# Patient Record
Sex: Male | Born: 1985 | Race: White | Hispanic: No | Marital: Married | Smoking: Former smoker
Health system: Southern US, Community
[De-identification: ages and names within clinical notes are randomized; demographics above are authoritative.]

## PROBLEM LIST (undated history)

## (undated) DIAGNOSIS — Z8619 Personal history of other infectious and parasitic diseases: Secondary | ICD-10-CM

## (undated) DIAGNOSIS — F4320 Adjustment disorder, unspecified: Secondary | ICD-10-CM

## (undated) DIAGNOSIS — R51 Headache: Secondary | ICD-10-CM

## (undated) DIAGNOSIS — J302 Other seasonal allergic rhinitis: Secondary | ICD-10-CM

## (undated) DIAGNOSIS — E785 Hyperlipidemia, unspecified: Secondary | ICD-10-CM

## (undated) DIAGNOSIS — R519 Headache, unspecified: Secondary | ICD-10-CM

## (undated) HISTORY — DX: Headache: R51

## (undated) HISTORY — DX: Personal history of other infectious and parasitic diseases: Z86.19

## (undated) HISTORY — DX: Headache, unspecified: R51.9

## (undated) HISTORY — DX: Hyperlipidemia, unspecified: E78.5

## (undated) HISTORY — DX: Other seasonal allergic rhinitis: J30.2

## (undated) HISTORY — DX: Adjustment disorder, unspecified: F43.20

---
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
## 2008-10-09 DIAGNOSIS — F4320 Adjustment disorder, unspecified: Secondary | ICD-10-CM

## 2008-10-09 HISTORY — DX: Adjustment disorder, unspecified: F43.20

## 2008-10-09 HISTORY — PX: MENISCUS REPAIR: SHX5179

## 2013-08-20 ENCOUNTER — Emergency Department: Payer: Self-pay | Admitting: Emergency Medicine

## 2013-11-18 ENCOUNTER — Encounter: Payer: Self-pay | Admitting: Family Medicine

## 2013-11-18 ENCOUNTER — Ambulatory Visit (INDEPENDENT_AMBULATORY_CARE_PROVIDER_SITE_OTHER): Payer: BC Managed Care – PPO | Admitting: Family Medicine

## 2013-11-18 VITALS — BP 126/74 | HR 76 | Temp 97.9°F | Ht 72.0 in | Wt 217.2 lb

## 2013-11-18 DIAGNOSIS — E785 Hyperlipidemia, unspecified: Secondary | ICD-10-CM

## 2013-11-18 DIAGNOSIS — J309 Allergic rhinitis, unspecified: Secondary | ICD-10-CM

## 2013-11-18 DIAGNOSIS — Z0000 Encounter for general adult medical examination without abnormal findings: Secondary | ICD-10-CM

## 2013-11-18 DIAGNOSIS — J302 Other seasonal allergic rhinitis: Secondary | ICD-10-CM

## 2013-11-18 DIAGNOSIS — R51 Headache: Secondary | ICD-10-CM

## 2013-11-18 DIAGNOSIS — R519 Headache, unspecified: Secondary | ICD-10-CM

## 2013-11-18 NOTE — Assessment & Plan Note (Signed)
Preventative protocols reviewed and updated unless pt declined. Discussed healthy diet and lifestyle.  

## 2013-11-18 NOTE — Assessment & Plan Note (Signed)
Will check FLP when he returns for next fasting blood work.

## 2013-11-18 NOTE — Progress Notes (Signed)
Pre-visit discussion using our clinic review tool. No additional management support is needed unless otherwise documented below in the visit note.  

## 2013-11-18 NOTE — Patient Instructions (Signed)
Good to meet you today, call us with questions. Return at your convenience fasting for blood work to check cholesterol levels.

## 2013-11-18 NOTE — Progress Notes (Signed)
BP 126/74  Pulse 76  Temp(Src) 97.9 F (36.6 C) (Oral)  Ht 6' (1.829 m)  Wt 217 lb 4 oz (98.544 kg)  BMI 29.46 kg/m2   CC: new pt to establish, desires CPE  Subjective:    Patient ID: Randy Riddle, male    DOB: Aug 06, 1986, 28 y.o.   MRN: [id number]  HPI: Randy Lingereter Withrow is a 28 y.o. 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  Has 26yo and 26yo.  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 Allergies and medications reviewed and updated. No current outpatient prescriptions on file prior to visit.   No current facility-administered medications on file prior to visit.    Review of Systems  Constitutional: Negative for fever, chills, activity change, appetite change, fatigue and unexpected weight change.  HENT: Negative for hearing loss.   Eyes: Negative for visual disturbance.  Respiratory: Positive for cough (getting over cold). Negative for chest tightness, shortness of breath and wheezing.   Cardiovascular: Negative for chest pain, palpitations and leg swelling.  Gastrointestinal: Negative for nausea, vomiting, abdominal pain, diarrhea, constipation, blood in stool and abdominal distention.  Genitourinary: Negative for hematuria and difficulty urinating.  Musculoskeletal: Negative for arthralgias, myalgias and neck pain.  Skin: Negative for rash.  Neurological: Positive for headaches (occasional). Negative for dizziness, seizures and syncope.  Hematological: Negative for adenopathy. Does not bruise/bleed easily.  Psychiatric/Behavioral: Negative for dysphoric mood. The patient is not nervous/anxious.     Per HPI unless specifically indicated above    Objective:    BP 126/74  Pulse 76  Temp(Src) 97.9 F (36.6 C) (Oral)  Ht 6' (1.829 m)  Wt 217 lb 4 oz (98.544 kg)  BMI 29.46 kg/m2  Physical Exam  Nursing note and vitals reviewed. Constitutional: He is oriented to person, place, and time. He appears well-developed and well-nourished. No distress.  HENT:  Head: Normocephalic and atraumatic.  Right Ear: Hearing, tympanic membrane, external ear and ear canal normal.  Left Ear: Hearing, tympanic membrane, external ear and ear canal normal.  Nose: Nose normal.  Mouth/Throat: Uvula is midline, oropharynx is clear and moist and mucous membranes are normal. No oropharyngeal exudate, posterior oropharyngeal edema or posterior oropharyngeal erythema.  Eyes: Conjunctivae and EOM are normal. Pupils are equal, round, and reactive to light. No scleral icterus.  Neck: Normal range of motion. Neck supple. No thyromegaly present.  Cardiovascular: Normal rate, regular rhythm, normal heart sounds and intact distal pulses.   No murmur heard. Pulses:      Radial pulses are 2+ on the right side, and 2+ on the left side.  Pulmonary/Chest: Effort normal and breath sounds normal. No respiratory distress. He has no wheezes. He has no rales.  Abdominal: Soft. Bowel sounds are normal. He exhibits no distension and no mass. There is no tenderness. There is no rebound and no guarding.  Musculoskeletal: Normal range of motion. He exhibits no edema.  Lymphadenopathy:    He has no cervical adenopathy.  Neurological: He is alert and oriented to person, place, and time.  CN grossly intact, station and gait intact  Skin: Skin is warm and dry. No rash noted.  Psychiatric: He  has a normal mood and affect. His behavior is normal. Judgment and thought content normal.   No results found for this or any previous visit.    Assessment & Plan:   Problem List Items Addressed This Visit   Headache   Health maintenance  examination - Primary     Preventative protocols reviewed and updated unless pt declined. Discussed healthy diet and lifestyle.    HLD (hyperlipidemia)     Will check FLP when he returns for next fasting blood work.    Seasonal allergies       Follow up plan: No Follow-up on file.

## 2013-11-20 ENCOUNTER — Other Ambulatory Visit (INDEPENDENT_AMBULATORY_CARE_PROVIDER_SITE_OTHER): Payer: BC Managed Care – PPO

## 2013-11-20 DIAGNOSIS — E785 Hyperlipidemia, unspecified: Secondary | ICD-10-CM

## 2013-11-20 LAB — LIPID PANEL
CHOLESTEROL: 229 mg/dL — AB (ref 0–200)
HDL: 47.2 mg/dL (ref >39.00)
TRIGLYCERIDES: 145 mg/dL (ref 0.0–149.0)
Total CHOL/HDL Ratio: 5
VLDL: 29 mg/dL (ref 0.0–40.0)

## 2013-11-20 LAB — BASIC METABOLIC PANEL
BUN: 14 mg/dL (ref 6–23)
CO2: 27 mEq/L (ref 19–32)
CREATININE: 1.1 mg/dL (ref 0.4–1.5)
Calcium: 9.9 mg/dL (ref 8.4–10.5)
Chloride: 104 mEq/L (ref 96–112)
GFR: 86.07 mL/min (ref >60.00)
Glucose, Bld: 90 mg/dL (ref 70–99)
Potassium: 3.8 mEq/L (ref 3.5–5.1)
Sodium: 140 mEq/L (ref 135–145)

## 2013-11-20 LAB — LDL CHOLESTEROL, DIRECT: LDL DIRECT: 107.3 mg/dL

## 2013-11-24 ENCOUNTER — Encounter: Payer: Self-pay | Admitting: *Deleted

## 2014-01-05 ENCOUNTER — Ambulatory Visit (INDEPENDENT_AMBULATORY_CARE_PROVIDER_SITE_OTHER)
Admission: RE | Admit: 2014-01-05 | Discharge: 2014-01-05 | Disposition: A | Discharge status: Home / Self Care | Payer: BC Managed Care – PPO | Source: Ambulatory Visit | Attending: Family Medicine | Admitting: Family Medicine

## 2014-01-05 ENCOUNTER — Ambulatory Visit (INDEPENDENT_AMBULATORY_CARE_PROVIDER_SITE_OTHER): Payer: BC Managed Care – PPO | Admitting: Family Medicine

## 2014-01-05 ENCOUNTER — Encounter: Payer: Self-pay | Admitting: Family Medicine

## 2014-01-05 VITALS — BP 118/78 | HR 88 | Temp 98.4°F | Wt 224.5 lb

## 2014-01-05 DIAGNOSIS — S6990XA Unspecified injury of unspecified wrist, hand and finger(s), initial encounter: Secondary | ICD-10-CM

## 2014-01-05 DIAGNOSIS — S6992XA Unspecified injury of left wrist, hand and finger(s), initial encounter: Secondary | ICD-10-CM

## 2014-01-05 NOTE — Progress Notes (Signed)
   BP 118/78  Pulse 88  Temp(Src) 98.4 F (36.9 C) (Oral)  Wt 224 lb 8 oz (101.833 kg)   CC: L hand injury  Subjective:    Patient ID: Randy Riddle, male    DOB: [birth year], 28 y.o.   MRN: [id number]  HPI: Randy Lingereter Shelden is a 28 y.o. male presenting on 01/05/2014 for left hand swollen   DOI: 01/01/2014 At work - hit L hand with dead blow hammer.  Swelling L dorsal mid-hand just proximal to 2nd MCP. Using ibuprofen and ice.  Relevant past medical, surgical, family and social history reviewed and updated as indicated.  Allergies and medications reviewed and updated. Current Outpatient Prescriptions on File Prior to Visit  Medication Sig  . Multiple Vitamin (MULTIVITAMIN) tablet Take 1 tablet by mouth daily.   No current facility-administered medications on file prior to visit.    Review of Systems Per HPI unless specifically indicated above    Objective:    BP 118/78  Pulse 88  Temp(Src) 98.4 F (36.9 C) (Oral)  Wt 224 lb 8 oz (101.833 kg)  Physical Exam  Nursing note and vitals reviewed. Constitutional: He appears well-developed and well-nourished. No distress.  Musculoskeletal: He exhibits edema.  FROM at wrists without tenderness to palpation.   Bruising dorsal and ventral palm Marked swelling and point tenderness at distal 2nd L metacarpal just proximal to MCP joint. Sensation intact, pulses intact  Skin: Skin is warm and dry. Bruising noted.   Results for orders placed in visit on 11/20/13  LIPID PANEL      Result Value Ref Range   Cholesterol 229 (*) 0 - 200 mg/dL   Triglycerides 478.2145.0  0.0 - 149.0 mg/dL   HDL 95.6247.20  >13.08>39.00 mg/dL   VLDL 65.729.0  0.0 - 84.640.0 mg/dL   Total CHOL/HDL Ratio 5    BASIC METABOLIC PANEL      Result Value Ref Range   Sodium 140  135 - 145 mEq/L   Potassium 3.8  3.5 - 5.1 mEq/L   Chloride 104  96 - 112 mEq/L   CO2 27  19 - 32 mEq/L   Glucose, Bld 90  70 - 99 mg/dL   BUN 14  6 - 23 mg/dL   Creatinine, Ser 1.1  0.4 - 1.5 mg/dL   Calcium  9.9  8.4 - 96.210.5 mg/dL   GFR 95.2886.07  >41.32>60.00 mL/min  LDL CHOLESTEROL, DIRECT      Result Value Ref Range   Direct LDL 107.3        Assessment & Plan:   Problem List Items Addressed This Visit   Injury of left hand - Primary     Occurred at work, work aware. Xray today - overall clear on my read, will await rad report. Anticipate significant bony contusion - will treat with wrapping hand with ace bandage, ice, NSAIDs and tramadol prn breakthrough pain rtc 1 wk for f/u.    Relevant Orders      DG Hand Complete Left       Follow up plan: Return in about 1 week (around 01/12/2014), or if symptoms worsen or fail to improve, for follow up visit.

## 2014-01-05 NOTE — Assessment & Plan Note (Signed)
Occurred at work, work aware. Xray today - overall clear on my read, will await rad report. Anticipate significant bony contusion - will treat with wrapping hand with ace bandage, ice, NSAIDs and tramadol prn breakthrough pain rtc 1 wk for f/u.

## 2014-01-05 NOTE — Patient Instructions (Signed)
xrays looking ok - will await radiology report and call you if any change in plan. I think you had a bony contusion after hammer injury. Treat with wrapping hand with ace bandage for compression, keep it elevated as able, continue ice as needed, continue ibuprofen 400-600mg  with meals.  Let me know if you want anything stronger for pain. Return if not improving as expected. Wrap hand while at work.

## 2014-01-05 NOTE — Progress Notes (Signed)
Pre visit review using our clinic review tool, if applicable. No additional management support is needed unless otherwise documented below in the visit note. 

## 2014-01-12 ENCOUNTER — Encounter: Payer: Self-pay | Admitting: Family Medicine

## 2014-01-12 ENCOUNTER — Ambulatory Visit (INDEPENDENT_AMBULATORY_CARE_PROVIDER_SITE_OTHER): Payer: BC Managed Care – PPO | Admitting: Family Medicine

## 2014-01-12 VITALS — BP 116/70 | HR 68 | Temp 98.1°F | Wt 234.0 lb

## 2014-01-12 DIAGNOSIS — S6990XA Unspecified injury of unspecified wrist, hand and finger(s), initial encounter: Secondary | ICD-10-CM

## 2014-01-12 DIAGNOSIS — S6992XA Unspecified injury of left wrist, hand and finger(s), initial encounter: Secondary | ICD-10-CM

## 2014-01-12 NOTE — Progress Notes (Addendum)
   BP 116/70  Pulse 68  Temp(Src) 98.1 F (36.7 C) (Oral)  Wt 234 lb (106.142 kg)   CC: f/u hand injury  Subjective:    Patient ID: Randy Riddle, male    DOB: 03/05/1986, 28 y.o.   MRN: [id number]  HPI: Randy Lingereter Riddle is a 28 y.o. male presenting on 01/12/2014 for Follow-up   See prior note for details.  Presents today for 1 wk f/u. DOI: 01/01/2014  Hit L hand with dead blow hammer at work. Swelling L dorsal mid-hand just proximal to 2nd MCP. Work aware. Using ibuprofen and ice.  Last week anticipated significant bony contusion - treated with wrapping hand with ace bandage, ice, NSAIDs.  Pt declined tramadol.  Pt has not been icing, has not needed ibuprofen.  Bruising has improved but swelling persists.  Pain not affecting day to day activities.  EXAM: LEFT HAND - COMPLETE 3+ VIEW  COMPARISON: None.  FINDINGS:  Three views of left hand submitted. No acute fracture or  subluxation. No radiopaque foreign body.  IMPRESSION:  Negative.  Electronically Signed  By: Natasha MeadLiviu Pop M.D.  On: 01/05/2014 10:15   Relevant past medical, surgical, family and social history reviewed and updated as indicated.  Allergies and medications reviewed and updated. Current Outpatient Prescriptions on File Prior to Visit  Medication Sig  . ibuprofen (ADVIL,MOTRIN) 200 MG tablet Take 200 mg by mouth every 6 (six) hours as needed.  . Multiple Vitamin (MULTIVITAMIN) tablet Take 1 tablet by mouth daily.   No current facility-administered medications on file prior to visit.    Review of Systems Per HPI unless specifically indicated above    Objective:    BP 116/70  Pulse 68  Temp(Src) 98.1 F (36.7 C) (Oral)  Wt 234 lb (106.142 kg)  Physical Exam  Nursing note and vitals reviewed. Constitutional: He appears well-developed and well-nourished. No distress.  Musculoskeletal:  R hand WNL L hand - Soft tissue swelling left dorsal hand just proximal to 2nd MCPJ.  Mild tenderness to palpation at goose egg  as well as at medial palmar MCPJ. FROM at fingers, grip and intrinsics strength intact, sensation intact, 2+ rad pulses       Assessment & Plan:   Problem List Items Addressed This Visit   Injury of left hand - Primary     Bony and soft tissue contusion of left hand after hit by mallet hammer. Slowly improving. Recommend continued wrap during the day and ice at night time.   Recommend strengthening exercises as able at night time with squeeze ball. Update if not improving as expected.        Follow up plan: Return if symptoms worsen or fail to improve.

## 2014-01-12 NOTE — Patient Instructions (Signed)
Hand is slowly improving.  Continue wrap for next week while at work.  Use ice at night time when you get home from work. Let me know if not improving as expected.

## 2014-01-12 NOTE — Assessment & Plan Note (Addendum)
Bony and soft tissue contusion of left hand after hit by mallet hammer. Slowly improving. Recommend continued wrap during the day and ice at night time.   Recommend strengthening exercises as able at night time with squeeze ball. Update if not improving as expected.

## 2014-01-12 NOTE — Progress Notes (Signed)
Pre visit review using our clinic review tool, if applicable. No additional management support is needed unless otherwise documented below in the visit note. 

## 2015-03-12 IMAGING — CR DG HAND COMPLETE 3+V*L*
3 series · 3 of 3 positions shown · non-contrast
Comparison: None.

CLINICAL DATA: Left hand injury 3 days ago

EXAM:
LEFT HAND - COMPLETE 3+ VIEW

[view not recorded (1 of 3)]
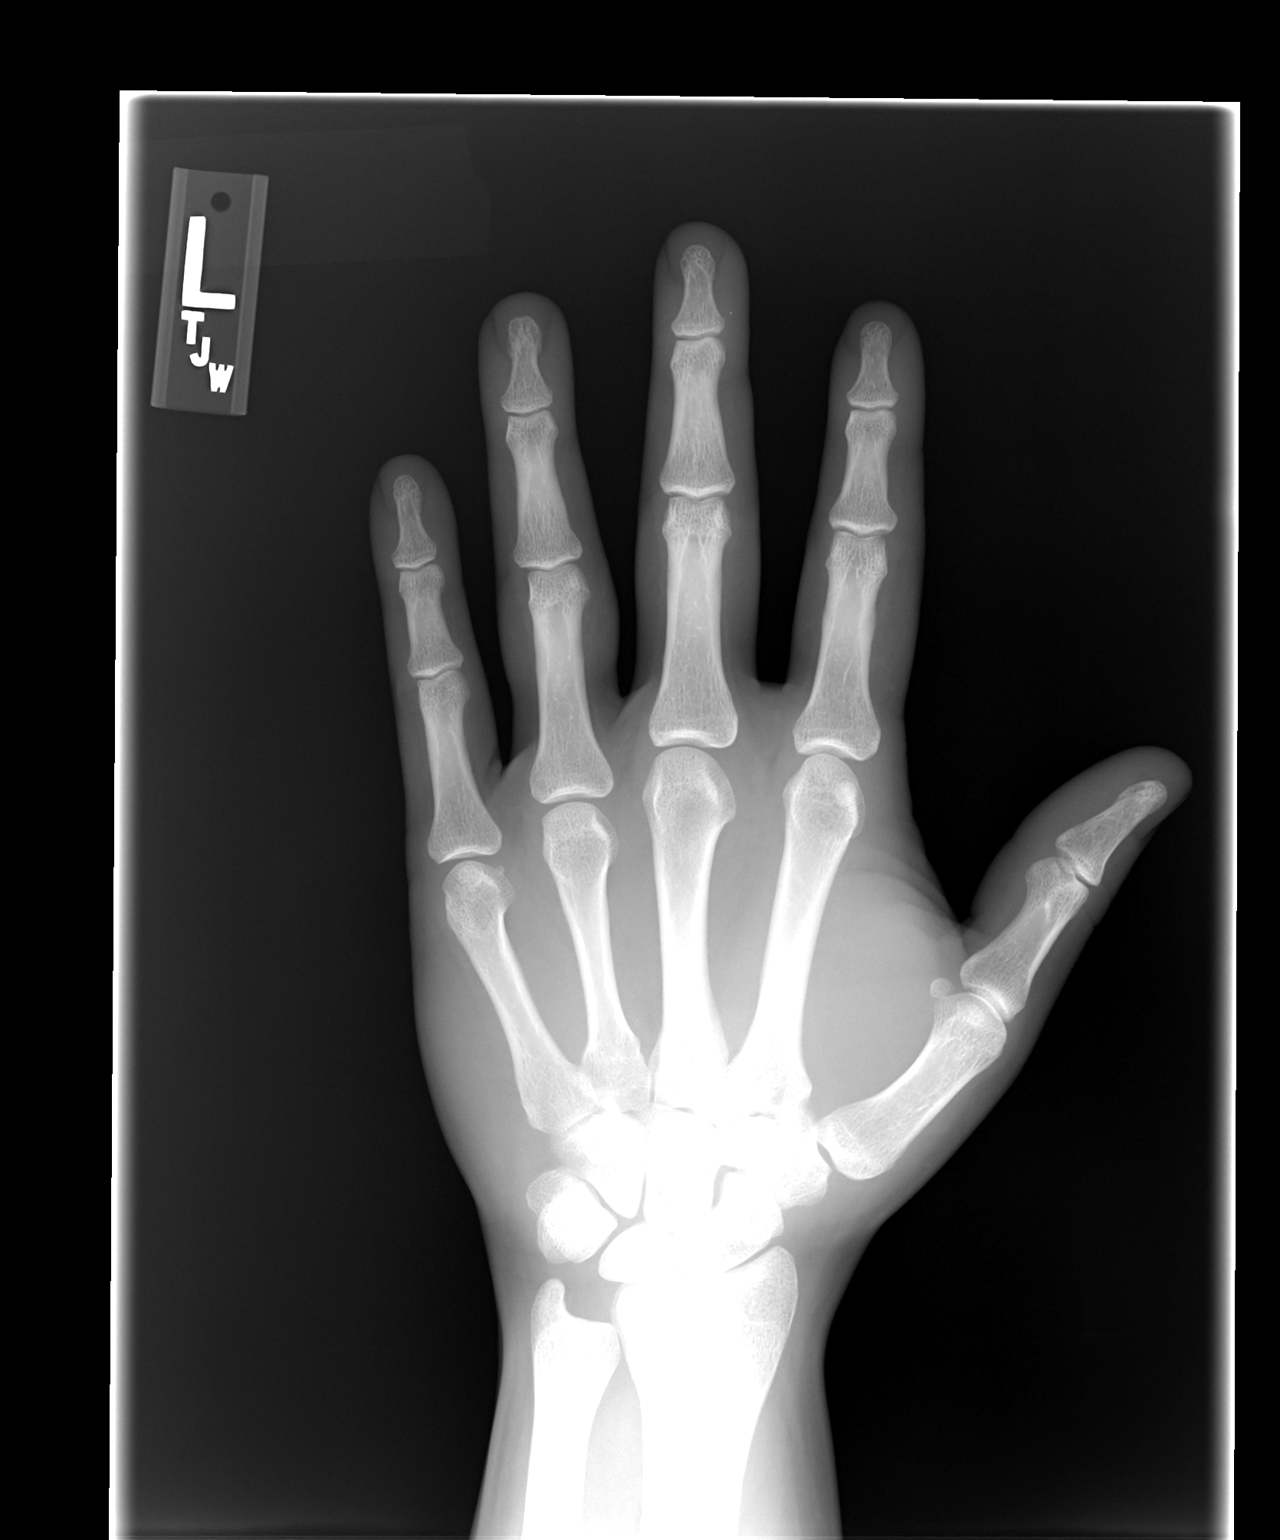

[view not recorded (2 of 3)]
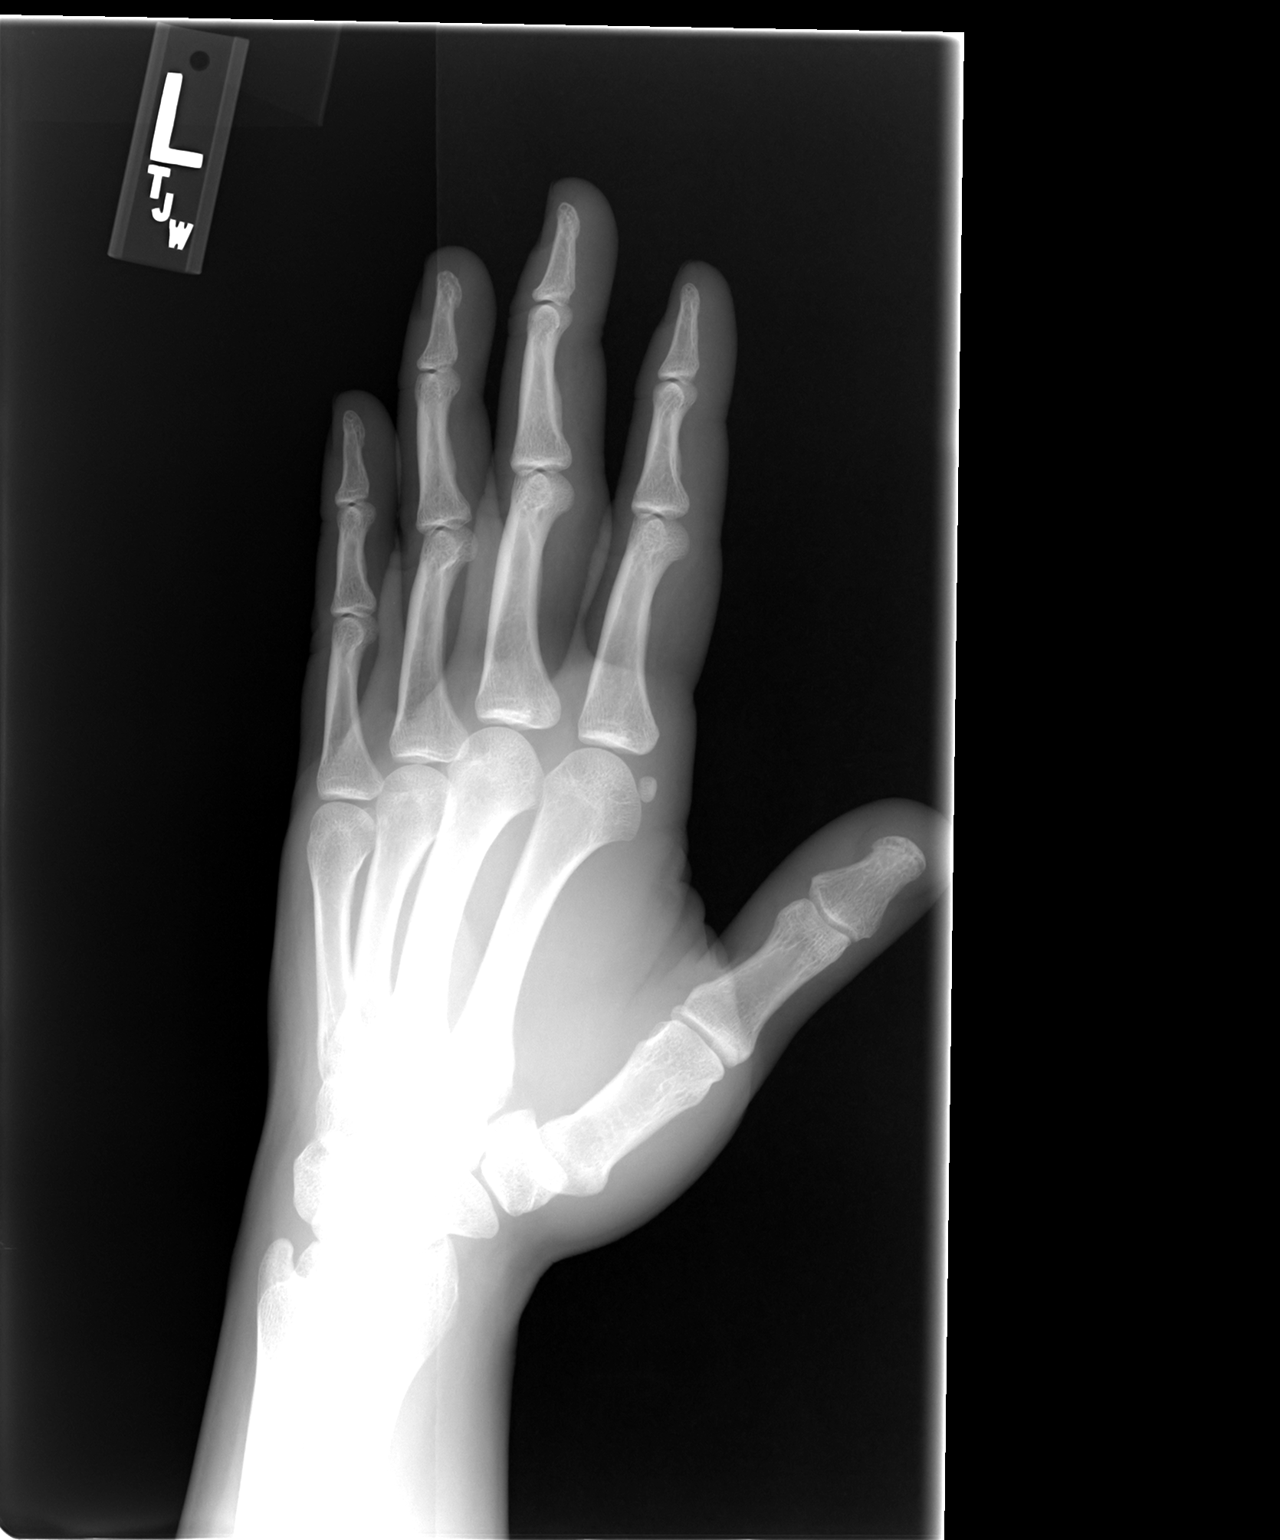

[view not recorded (3 of 3)]
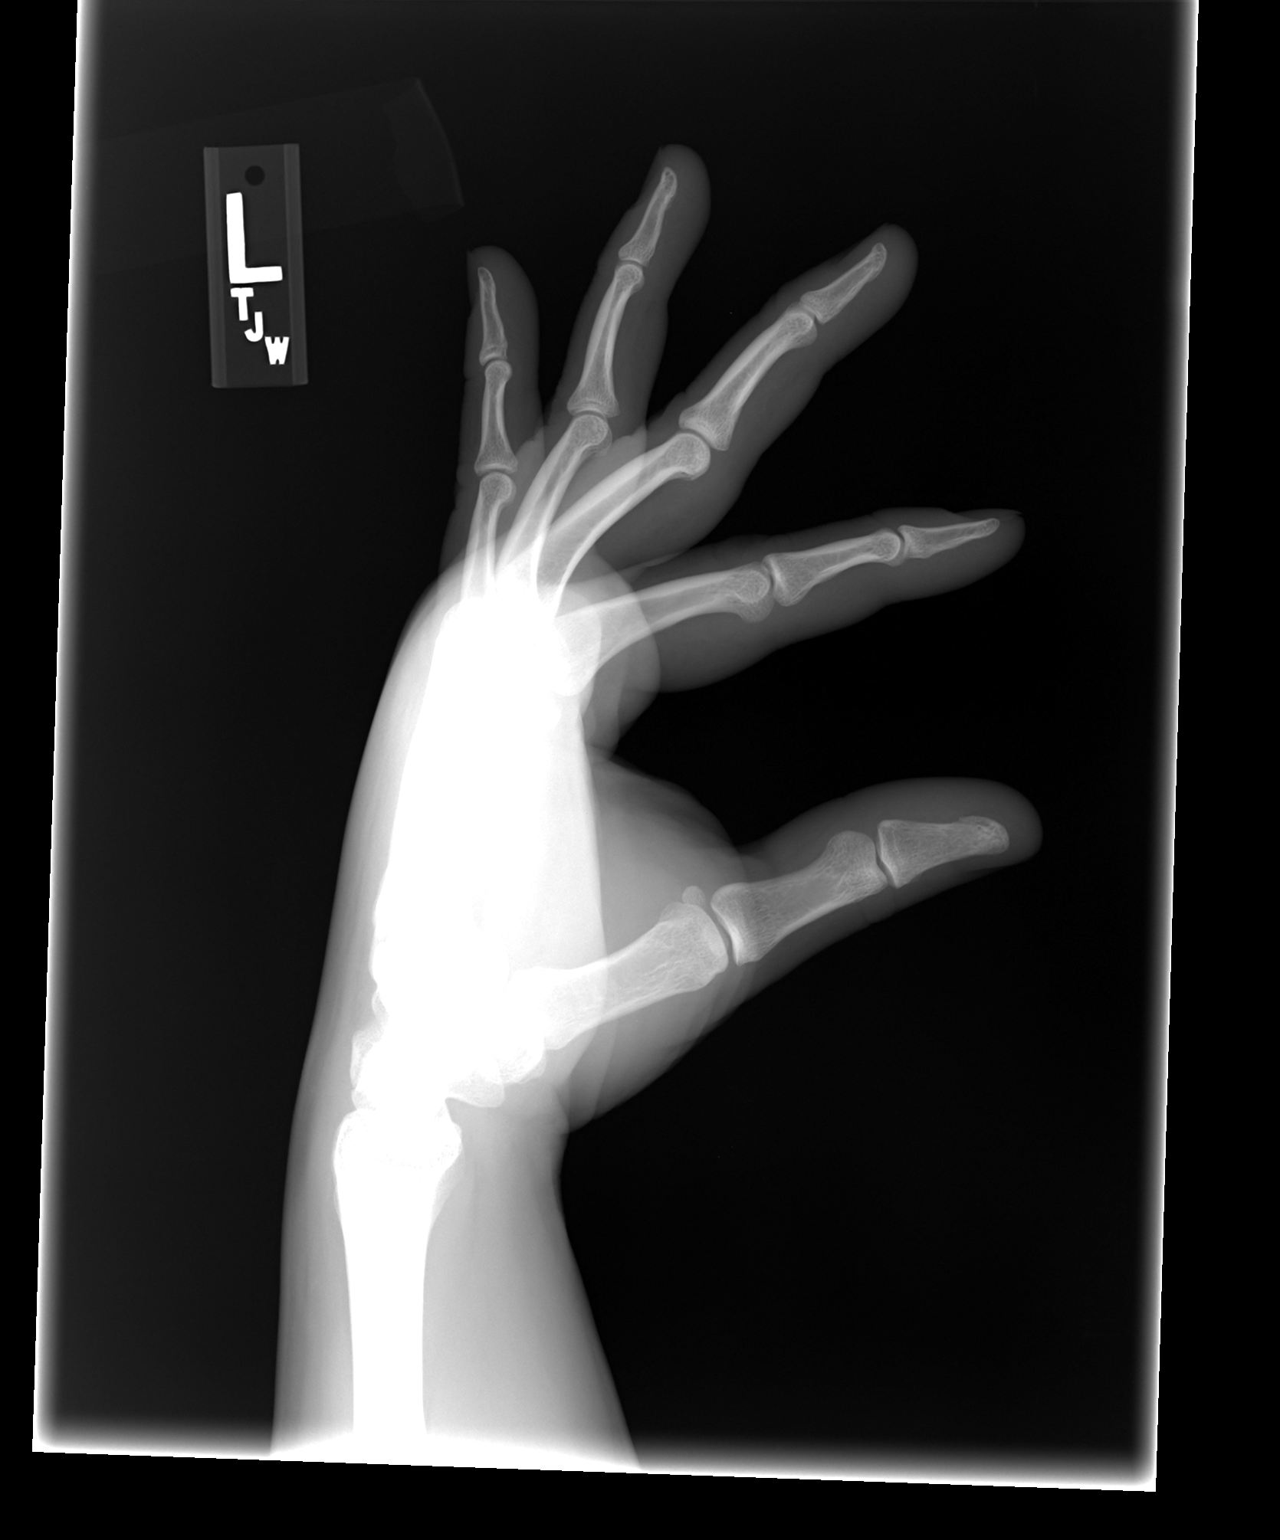

[3 of 3 positions shown; findings below may reference images not displayed]

FINDINGS: Three views of left hand submitted. No acute fracture or
subluxation. No radiopaque foreign body.
IMPRESSION: Negative.
